# Patient Record
Sex: Male | Born: 2000 | Race: White | Hispanic: No | Marital: Single | Smoking: Never smoker
Health system: Southern US, Community
[De-identification: ages and names within clinical notes are randomized; demographics above are authoritative.]

## PROBLEM LIST (undated history)

## (undated) DIAGNOSIS — Z8619 Personal history of other infectious and parasitic diseases: Secondary | ICD-10-CM

## (undated) HISTORY — DX: Personal history of other infectious and parasitic diseases: Z86.19

## (undated) HISTORY — PX: NO PAST SURGERIES: SHX2092

---
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
## 2020-01-28 IMAGING — DX DG HIP (WITH OR WITHOUT PELVIS) 2-3V*R*
3 series · 3 of 3 positions shown · non-contrast
Comparison: None.

CLINICAL DATA: Per Provider Note: Endorses 6 months of R hip
sensation described as a rubbing or grinding sensation. Pain only
with squats. Does not note radiating pain. Notes sensation with
walking prolonged periods of time. Sometimes noting mild ache. Rt
Hip pain.

EXAM:
DG HIP (WITH OR WITHOUT PELVIS) 2-3V RIGHT

[pelvis ap]
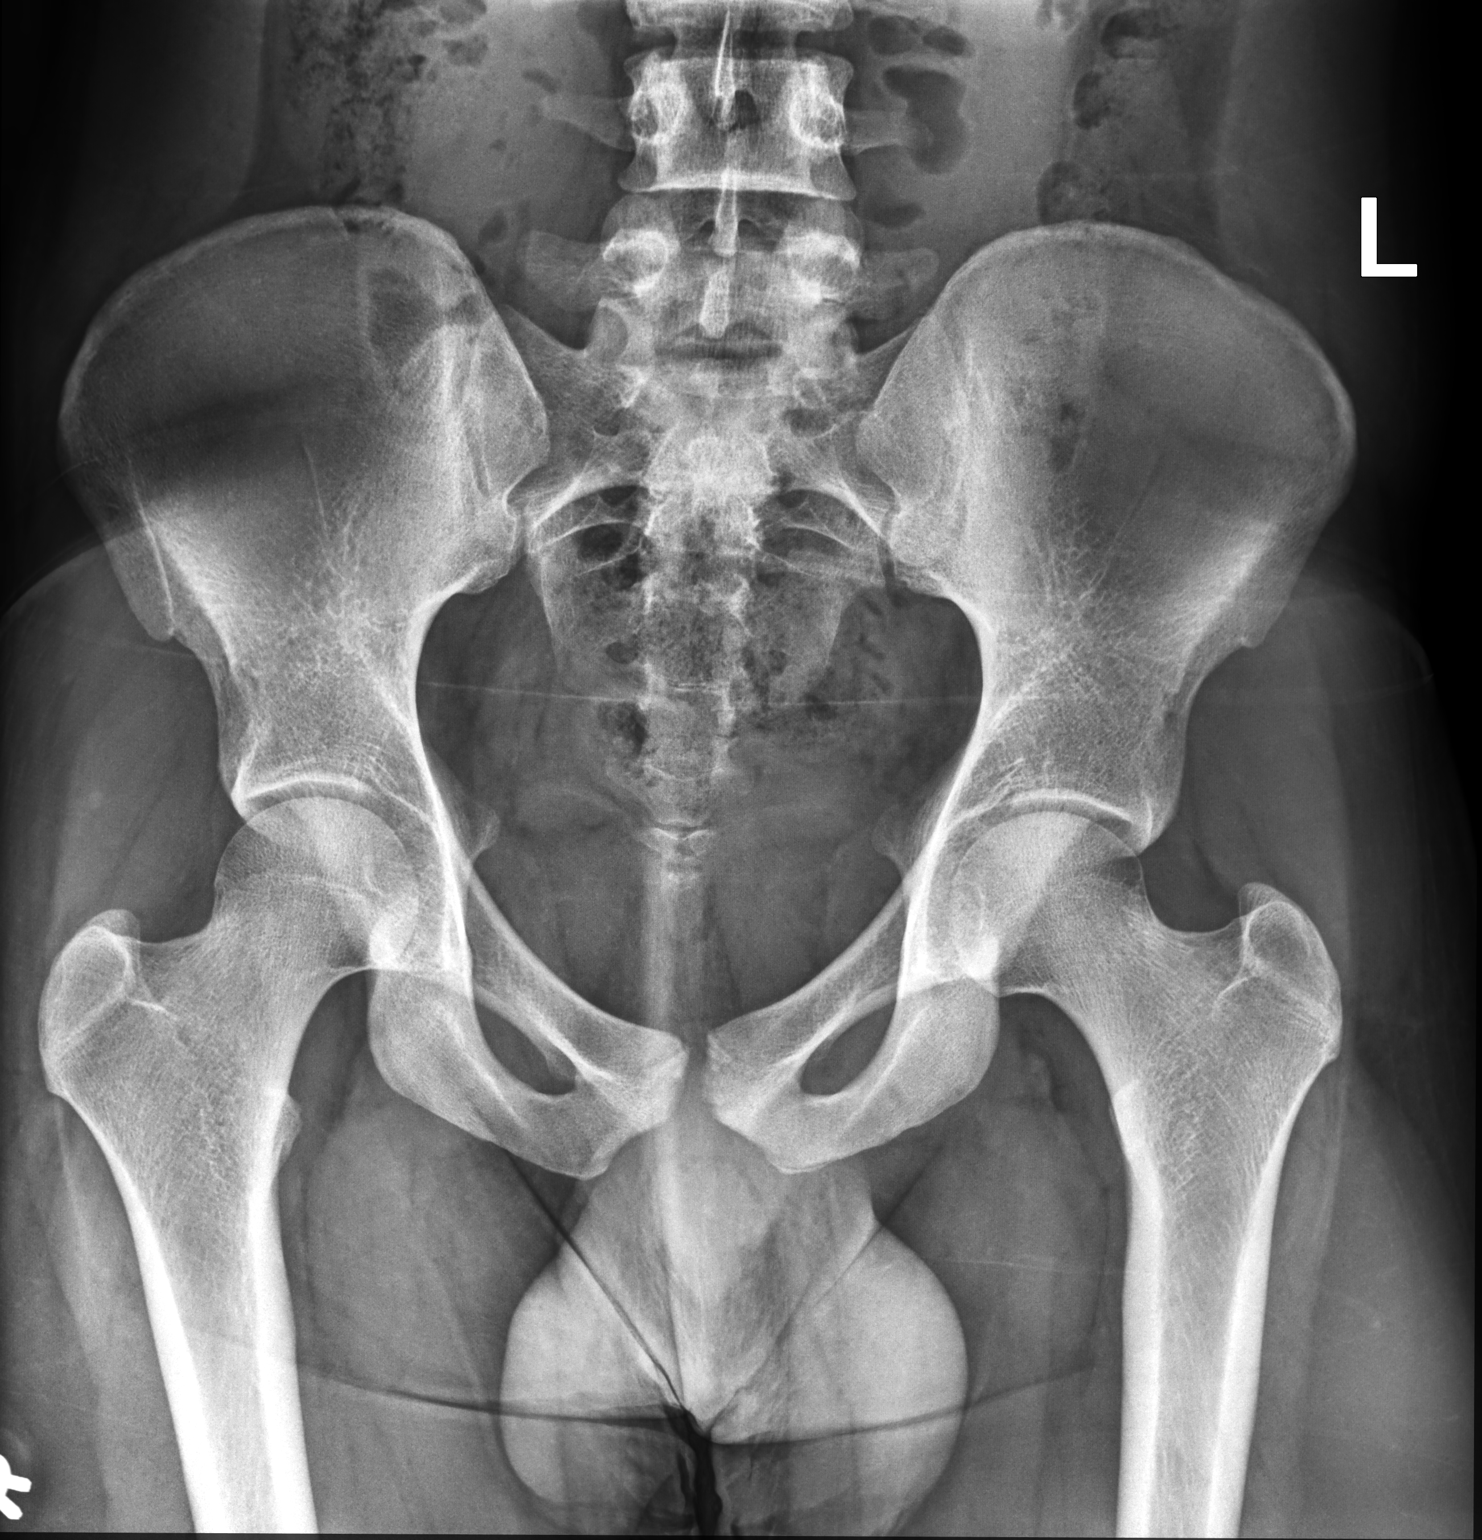

[hip joint ap]
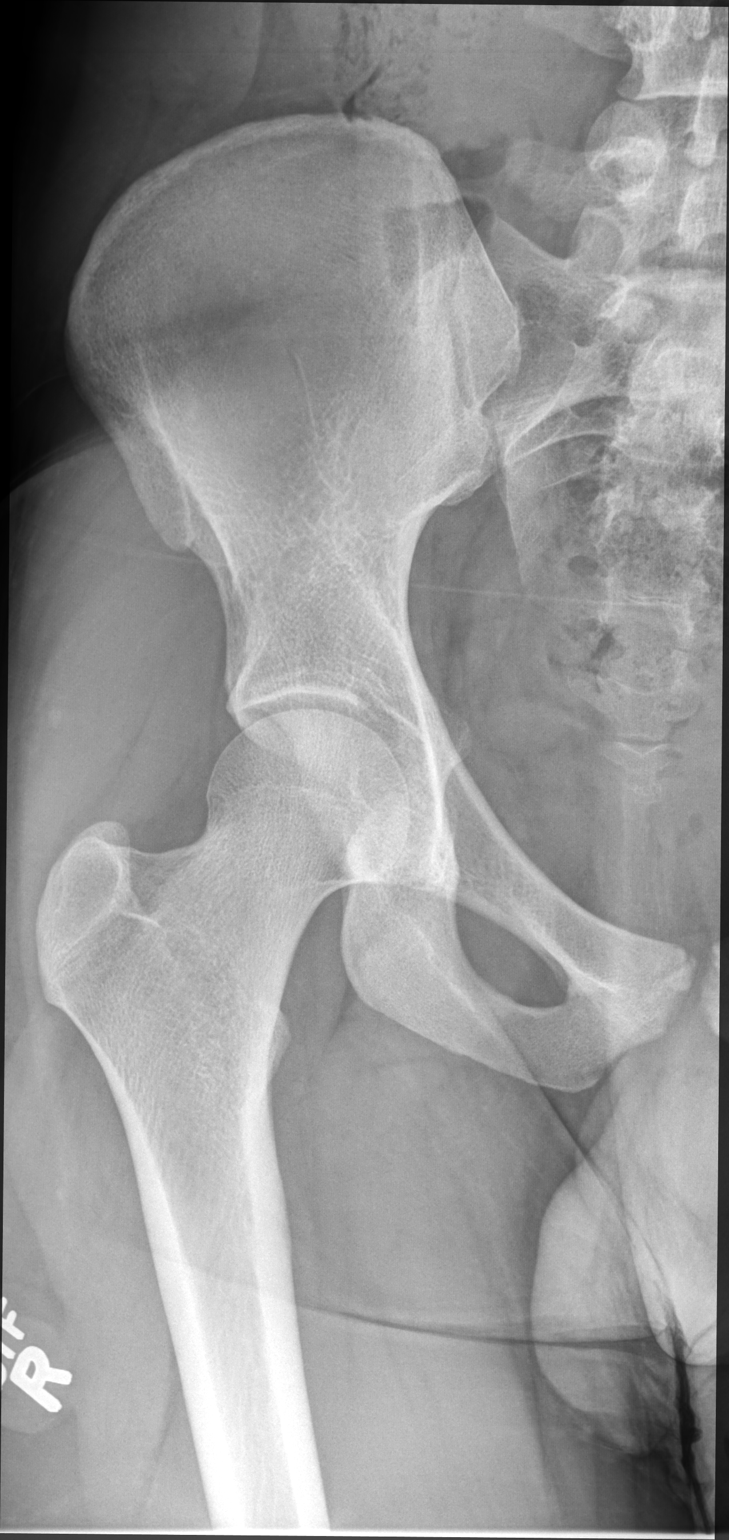

[hip joint (frog view)]
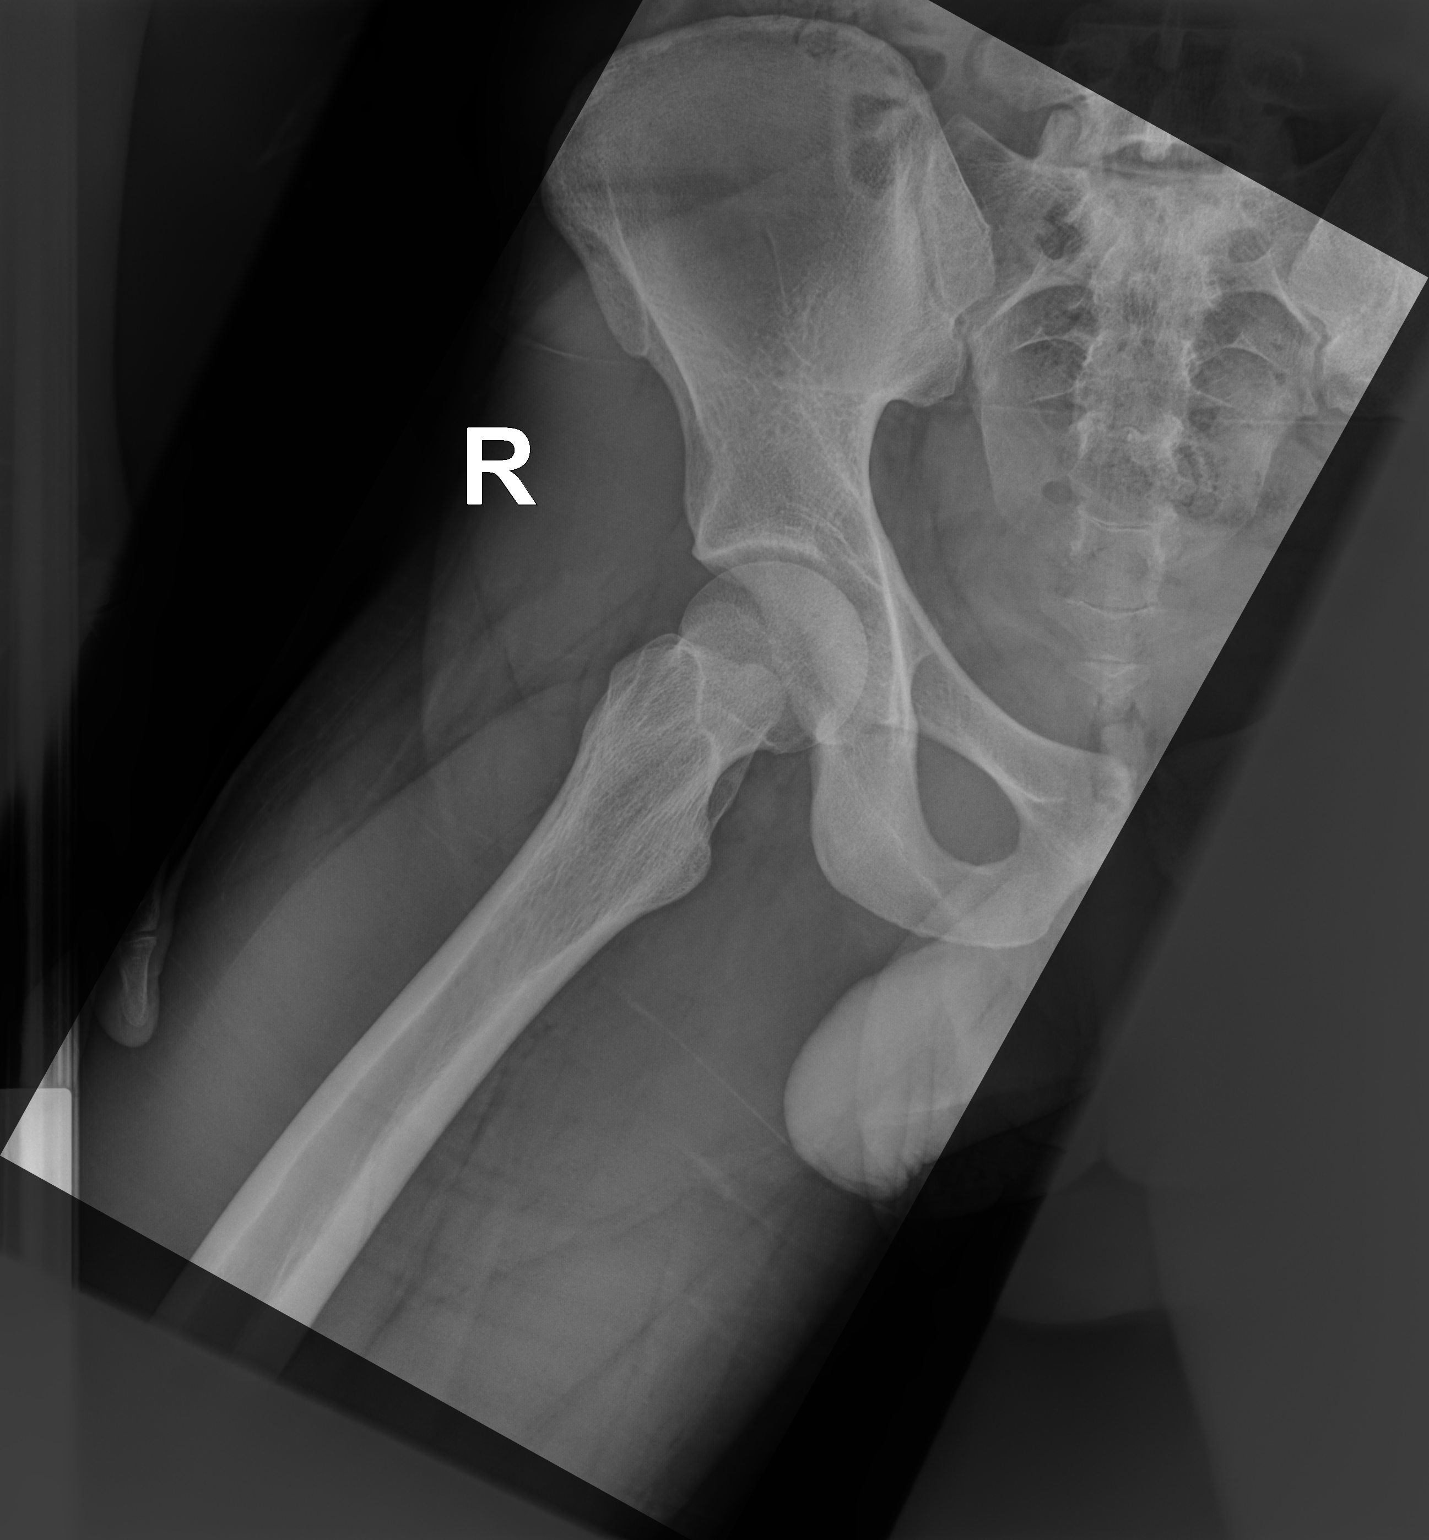

[3 of 3 positions shown; findings below may reference images not displayed]

FINDINGS: There is no evidence of hip fracture or dislocation. There is no
evidence of arthropathy or other focal bone abnormality.
IMPRESSION: Negative.

## 2020-01-28 IMAGING — DX DG FOOT COMPLETE 3+V*R*
3 series · 3 of 3 positions shown · non-contrast
Comparison: None.

CLINICAL DATA: Injury of toe on right foot, initial encounter

EXAM:
RIGHT FOOT COMPLETE - 3+ VIEW

[foot dp]
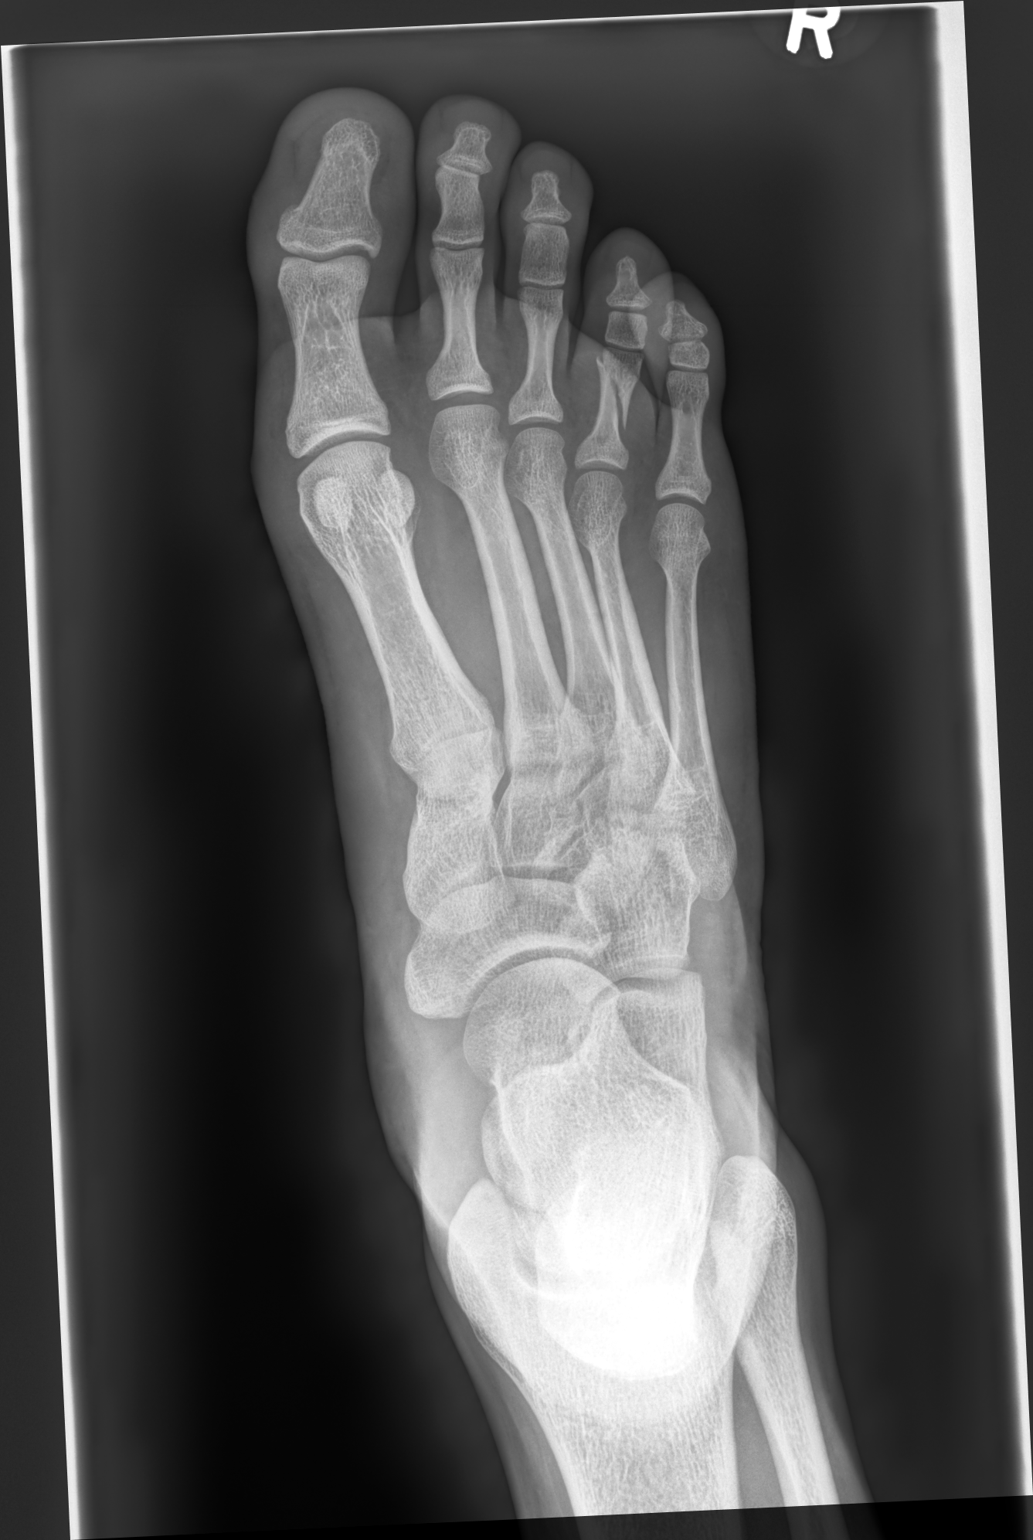

[foot oblique]
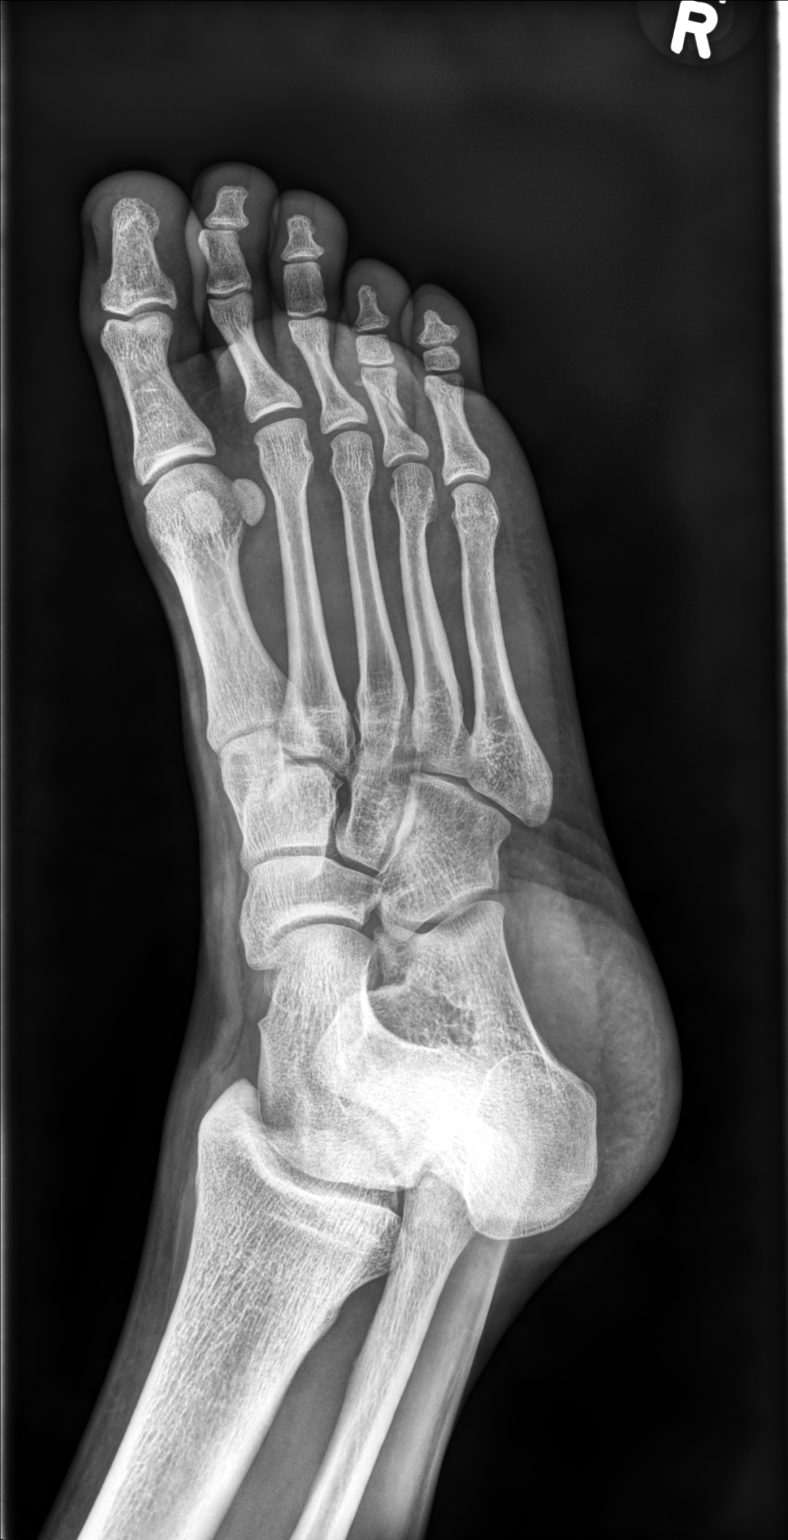

[foot lat]
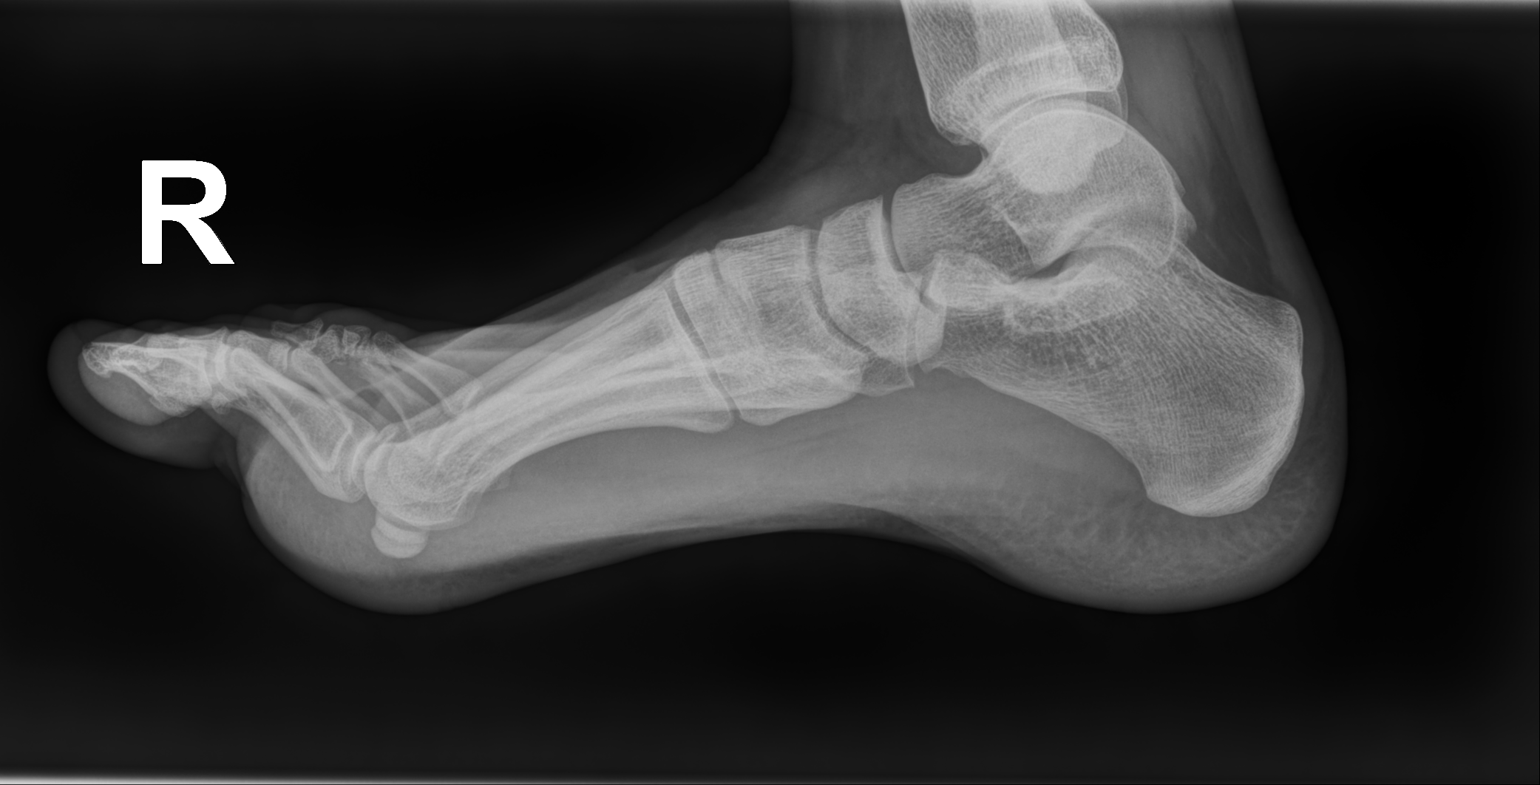

[3 of 3 positions shown; findings below may reference images not displayed]

FINDINGS: Oblique fracture across the proximal phalanx of fourth toe,
distracted approximately 1 mm with approximately 2 mm override and
mild lateral displacement of distal fracture fragment. No fracture
extension to the subchondral cortex is evident. No other bone
abnormality identified. Normal mineralization and alignment. No
significant osseous degenerative change.
IMPRESSION: Minimally displaced oblique fracture, proximal phalanx right fourth
toe.
